# Patient Record
Sex: Male | Born: 1990 | Race: Black or African American | Hispanic: No | Marital: Single | Smoking: Never smoker
Health system: Southern US, Community
[De-identification: ages and names within clinical notes are randomized; demographics above are authoritative.]

## PROBLEM LIST (undated history)

## (undated) DIAGNOSIS — F840 Autistic disorder: Secondary | ICD-10-CM

## (undated) HISTORY — PX: DENTAL SURGERY: SHX609

---
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
## 2009-07-01 ENCOUNTER — Emergency Department (HOSPITAL_COMMUNITY): Admission: EM | Admit: 2009-07-01 | Discharge: 2009-07-01 | Payer: Self-pay | Admitting: Emergency Medicine

## 2011-04-12 NOTE — Op Note (Signed)
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   SURGEON:  Gwendlyn Deutscher, DDS   SURGICAL PROCEDURE:  Surgical removal of impacted teeth #1A, 1, 16, 17,  31, 32, and 32A and grossly carious tooth #19 and dentigerous cyst of  the right mandible.   INDICATIONS FOR PROCEDURE:  This is a 20 year old male who was referred  by his family dentist because of grossly carious tooth #19 that was  causing him pain and discomfort and also impacted third molar teeth to  be removed.  The impacted teeth showed no evidence of eruption and had  insufficient space for eruption.  He had also impacted tooth #31 and a  supernumerary impacted tooth in the right mandible superior to tooth  #31.  The patient was attempted to be done in the outpatient with  obvious sedation; however, because of his mental retardation, he was  uncooperative and required [facility].   PROCEDURE AND FINDINGS:  The patient was brought to the operating room.  After a ketamine sedation and intravenous line was established, the  patient was sedated and placed under general anesthesia and intubated  without complication with an orotracheal tube.  The  tube was placed on  the right side.  The patient was draped with four towels and plus sheet  and a head sheet.  One moist sponge was placed in oropharynx as a throat  pack and using 0.5% Marcaine with 1:200,000 epinephrine bilateral infra-  alveolar and lingual nerve blocks were achieved for postoperative  comfort and using 2% Xylocaine with 1:100,000 epinephrine posterior  superior nerve blocks and a greater palatine nerve blocks were achieved  for postoperative comfort.  Using a 15 scalpel blade, an incision was  made along the  interdental papillas of tooth 18 in the retromolar area.  A full-thickness mucoperiosteal flap was elevated laterally exposing the  area of impacted tooth #17, that was mesioangular.  A rotary osteotome  was used to remove the buccal bone to expose the tooth.  The tooth was  sectioned buccolingually and each rim was elevated from the supporting  alveolar structure.  The area was thoroughly debrided and irrigated.  Using a rotary osteotome, the roots of the carious tooth #19 were split.  The medial rim and the distal rim were removed with forceps.  The  area  was thoroughly debrided and irrigated and the incision was closed with  multiple interrupted 3-0 chromic sutures.  Moving to left maxilla, a 15  scalpel blade was used to make incision in the left retromolar area and  a full-thickness mucoperiosteal flap was elevated laterally.  The rotary  osteotome was used to remove buccal bone exposing the impacted tooth  #16.  Using appropriate elevators, the tooth was elevated from the  supporting alveolar structure.  The area was debrided and irrigated and  closed with multiple interrupted 3-0 gut sutures.  The endotracheal tube  was moved to the left side.  The throat pack was removed and repacked  and the mouth prop was placed on the left side.  Using a 15 scalpel  blade an incision was made anterior to tooth #30 and inter-dental  papillas and the retromolar area of  the right mandible was incised down  the periosteum.  A full-thickness mucoperiosteal flap was elevated  laterally exposing the right ramus in the body of the mandible.  Using a  rotary osteotome, buccal and occlusal bone was removed from impacted  tooth #32.  The tooth was split medio-distally and each rim was removed  with appropriate forceps.  A large dental follicle or cystic-like lesion  was noted and this was removed and curetted from the alveolar socket and  identified a dentigerous cyst and sent to pathology for histological  examination.  I partially impacted supernumerary tooth in the right  mandible distal to #30, was elevated from the supporting alveolar  structure.  Identification of impacted tooth #31 could then be seen.  The tooth was sectioned buccolingually and then the buccal bone was  removed from the area to completely expose the roots of tooth #31 and  each individual root was removed individually from the supporting  alveolar structure.  There was no excessive bleeding and there was no  evidence of nerve exposure.  The area was thoroughly debrided and  irrigated and the incision was closed with multiple interrupted 3-0 gut  sutures.  Moving to the right maxilla, an incision was made along with  the maxillary right tuberosity and then anteriorly to the first molar  tooth a releasing incision was made vertically.  A full-thickness  mucoperiosteal flap was elevated laterally.  Using a rotary osteotome  buccal and occlusal bone was removed and impacted supernumerary tooth  #1A was identified and removed from the supporting alveolar structure.  The tooth was only one-third of the size of a normal molar tooth.  The  crown of impacted tooth #1 could be identified and removed with multiple  elevators.  The area was thoroughly debrided and irrigated and the  incision was closed with multiple interrupted 3-0 gut sutures.   The patient tolerated the surgery and the anesthesia  without  complications.  Estimated blood loss was 25-50 mL.   The patient was awakened and taken to the recovery room in stable  condition.  He tolerated the surgery well.   SPECIMEN SENT:  Dentigerous cyst from right mandible for histological  examination.      Grant Ruts., D.D.S.  Electronically Signed     WB/MEDQ  D:  09/08/2008  T:  09/09/2008  Job:  [id number]

## 2011-12-21 ENCOUNTER — Encounter (HOSPITAL_COMMUNITY): Payer: Self-pay | Admitting: Emergency Medicine

## 2011-12-21 ENCOUNTER — Emergency Department (HOSPITAL_COMMUNITY)
Admission: EM | Admit: 2011-12-21 | Discharge: 2011-12-21 | Disposition: A | Discharge status: Home / Self Care | Payer: Medicaid Other | Attending: Emergency Medicine | Admitting: Emergency Medicine

## 2011-12-21 DIAGNOSIS — K625 Hemorrhage of anus and rectum: Secondary | ICD-10-CM | POA: Insufficient documentation

## 2011-12-21 DIAGNOSIS — K921 Melena: Secondary | ICD-10-CM | POA: Insufficient documentation

## 2011-12-21 HISTORY — DX: Autistic disorder: F84.0

## 2011-12-21 NOTE — ED Provider Notes (Signed)
History     CSN: [id number]  Arrival date & time 12/21/11  2142   First MD Initiated Contact with Patient 12/21/11 2214      Chief Complaint  Patient presents with  . Rectal Bleeding    (Consider location/radiation/quality/duration/timing/severity/associated sxs/prior treatment) HPI  Patient is brought to ER by his mother with complaint of blood in stool. Patient is autistic. Patient called his aunt after having a bowel movement stating "there is something funny in my underwear" and the aunt who is at bedside as well states that she found a "drop of bright red jelly like stool in his underwear." mother denies hx of GI bleed. Patient and mother deny abdominal pain, n/v, fevers, chills or any other complaint. The mother notes that the patient has a hx of intermittent constipation and straining.  Past Medical History  Diagnosis Date  . Autism     History reviewed. No pertinent past surgical history.  No family history on file.  History  Substance Use Topics  . Smoking status: Not on file  . Smokeless tobacco: Not on file  . Alcohol Use:       Review of Systems  All other systems reviewed and are negative.    Allergies  Review of patient's allergies indicates no known allergies.  Home Medications   Current Outpatient Rx  Name Route Sig Dispense Refill  . GABAPENTIN 300 MG PO CAPS Oral Take 300 mg by mouth at bedtime.       BP 118/84  Pulse 93  Temp 98 F (36.7 C)  Resp 16  Wt 140 lb (63.504 kg)  SpO2 99%  Physical Exam  Nursing note and vitals reviewed. Constitutional: He appears well-developed and well-nourished. No distress.  HENT:  Head: Normocephalic and atraumatic.  Eyes: Conjunctivae are normal.  Neck: Normal range of motion. Neck supple.  Cardiovascular: Normal rate, regular rhythm, normal heart sounds and intact distal pulses.  Exam reveals no gallop and no friction rub.   No murmur heard. Pulmonary/Chest: Effort normal and breath sounds normal.  No respiratory distress. He has no wheezes. He has no rales. He exhibits no tenderness.  Abdominal: Bowel sounds are normal. He exhibits no distension and no mass. There is no tenderness. There is no rebound and no guarding.  Genitourinary: Rectum normal and prostate normal. Guaiac negative stool.       No gross blood. Rectum is non tender. Good rectal tone.   Musculoskeletal: Normal range of motion. He exhibits no edema and no tenderness.  Neurological: He is alert.  Skin: Skin is warm and dry. No rash noted. He is not diaphoretic. No erythema.  Psychiatric: He has a normal mood and affect.    ED Course  Procedures (including critical care time)   Labs Reviewed  OCCULT BLOOD, POC DEVICE  POCT OCCULT BLOOD STOOL, DEVICE   No results found.   1. Blood in stool       MDM  Report of one episode of blood in stool with hemacult negative and no gross blood on exam. Abdomen soft and nontender with normal VS. Spoke at length with mother about changing or worsening of symptoms that should warrant return to ER for further work up otherwise following up with PCP for recheck. They voice understanding and are agreeable to plan.         Jenness Corner, [place] 12/21/11 2344

## 2011-12-21 NOTE — ED Notes (Signed)
Pt alert, presents with family, c/o noting "blood in stool", onset today, recent constipation, family noticed "jelly like" blood in underwear

## 2011-12-22 NOTE — ED Provider Notes (Signed)
Medical screening examination/treatment/procedure(s) were performed by non-physician practitioner and as supervising physician I was immediately available for consultation/collaboration.  Gerhard Munch, MD 12/22/11 0001

## 2018-09-08 ENCOUNTER — Emergency Department (HOSPITAL_COMMUNITY)
Admission: EM | Admit: 2018-09-08 | Discharge: 2018-09-08 | Disposition: A | Discharge status: Home / Self Care | Payer: Medicaid Other | Attending: Emergency Medicine | Admitting: Emergency Medicine

## 2018-09-08 ENCOUNTER — Emergency Department (HOSPITAL_COMMUNITY): Payer: Medicaid Other

## 2018-09-08 ENCOUNTER — Encounter (HOSPITAL_COMMUNITY): Payer: Self-pay | Admitting: Emergency Medicine

## 2018-09-08 DIAGNOSIS — R109 Unspecified abdominal pain: Secondary | ICD-10-CM | POA: Diagnosis present

## 2018-09-08 DIAGNOSIS — Z79899 Other long term (current) drug therapy: Secondary | ICD-10-CM | POA: Diagnosis not present

## 2018-09-08 DIAGNOSIS — F840 Autistic disorder: Secondary | ICD-10-CM | POA: Insufficient documentation

## 2018-09-08 LAB — URINALYSIS, ROUTINE W REFLEX MICROSCOPIC
Bilirubin Urine: NEGATIVE
GLUCOSE, UA: NEGATIVE mg/dL
Hgb urine dipstick: NEGATIVE
KETONES UR: NEGATIVE mg/dL
Leukocytes, UA: NEGATIVE
Nitrite: NEGATIVE
PH: 9 — AB (ref 5.0–8.0)
Protein, ur: NEGATIVE mg/dL
Specific Gravity, Urine: 1.008 (ref 1.005–1.030)

## 2018-09-08 LAB — COMPREHENSIVE METABOLIC PANEL
ALT: 17 U/L (ref 0–44)
AST: 24 U/L (ref 15–41)
Albumin: 3.8 g/dL (ref 3.5–5.0)
Alkaline Phosphatase: 47 U/L (ref 38–126)
Anion gap: 9 (ref 5–15)
BUN: 17 mg/dL (ref 6–20)
CHLORIDE: 102 mmol/L (ref 98–111)
CO2: 25 mmol/L (ref 22–32)
CREATININE: 0.95 mg/dL (ref 0.61–1.24)
Calcium: 9.5 mg/dL (ref 8.9–10.3)
GFR calc Af Amer: >60 mL/min (ref >60)
GLUCOSE: 101 mg/dL — AB (ref 70–99)
Potassium: 4.4 mmol/L (ref 3.5–5.1)
Sodium: 136 mmol/L (ref 135–145)
Total Bilirubin: 0.5 mg/dL (ref 0.3–1.2)
Total Protein: 6.8 g/dL (ref 6.5–8.1)

## 2018-09-08 LAB — CBC
HEMATOCRIT: 42 % (ref 39.0–52.0)
Hemoglobin: 13.8 g/dL (ref 13.0–17.0)
MCH: 30.3 pg (ref 26.0–34.0)
MCHC: 32.9 g/dL (ref 30.0–36.0)
MCV: 92.1 fL (ref 80.0–100.0)
Platelets: 154 10*3/uL (ref 150–400)
RBC: 4.56 MIL/uL (ref 4.22–5.81)
RDW: 12.3 % (ref 11.5–15.5)
WBC: 6.9 10*3/uL (ref 4.0–10.5)
nRBC: 0 % (ref 0.0–0.2)

## 2018-09-08 LAB — LIPASE, BLOOD: LIPASE: 49 U/L (ref 11–51)

## 2018-09-08 MED ORDER — IOHEXOL 300 MG/ML  SOLN
100.0000 mL | Freq: Once | INTRAMUSCULAR | Status: AC | PRN
  Administered 2018-09-08: 100 mL via INTRAVENOUS

## 2018-09-08 MED ORDER — HALOPERIDOL 1 MG PO TABS: 1.0000 mg | ORAL_TABLET | Freq: Once | ORAL | Status: DC

## 2018-09-08 MED ORDER — HALOPERIDOL LACTATE 5 MG/ML IJ SOLN
2.0000 mg | Freq: Once | INTRAMUSCULAR | Status: DC
  Filled 2018-09-08: qty 1

## 2018-09-08 MED ORDER — LORAZEPAM 2 MG/ML IJ SOLN
2.0000 mg | Freq: Once | INTRAMUSCULAR | Status: DC
Start: 2018-09-08 — End: 2018-09-09
  Filled 2018-09-08: qty 1

## 2018-09-08 MED ORDER — SODIUM CHLORIDE 0.9 % IV SOLN: INTRAVENOUS | Status: DC

## 2018-09-08 MED ORDER — MIDAZOLAM HCL 2 MG/ML PO SYRP
4.0000 mg | ORAL_SOLUTION | Freq: Once | ORAL | Status: AC
  Administered 2018-09-08: 4 mg via ORAL

## 2018-09-08 MED ORDER — LORAZEPAM 1 MG PO TABS
2.0000 mg | ORAL_TABLET | Freq: Once | ORAL | Status: AC
  Administered 2018-09-08: 2 mg via ORAL
  Filled 2018-09-08: qty 2

## 2018-09-08 NOTE — ED Notes (Signed)
Pt refuses IM, crying - "no mommy, please. I want to go home" Dr made aware.

## 2018-09-08 NOTE — ED Notes (Signed)
Pt has a hx of Autism, is pacing around the room, Mother states pt will need some sedation prior to having any lab work done. MD made aware.

## 2018-09-08 NOTE — ED Provider Notes (Signed)
[facility] EMERGENCY DEPARTMENT Provider Note   CSN: [id number] Arrival date & time: 09/08/18  1639     History   Chief Complaint Chief Complaint  Patient presents with  . Abdominal Pain  . Constipation    HPI Frederick Wilson is a 27 y.o. male.  27 year old male with history of autism who presents from outpatient clinic due to abdominal distention and possible volvulus diagnosed an acute abdominal series.  Mother states that she felt the patient may have had a UTI because of dark urine.  He had been complaining of lower abdominal discomfort.  No reported fever, vomiting.  Patient has chronic constipation which is unchanged.  No prior history of abdominal surgeries.  No treatment used prior to arrival.  Nothing makes his symptoms better or worse.     Past Medical History:  Diagnosis Date  . Autism     There are no active problems to display for this patient.   History reviewed. No pertinent surgical history.      Home Medications    Prior to Admission medications   Medication Sig Start Date End Date Taking? Authorizing Provider  gabapentin (NEURONTIN) 300 MG capsule Take 300 mg by mouth at bedtime.     [provider]    Family History No family history on file.  Social History Social History   Tobacco Use  . Smoking status: Not on file  Substance Use Topics  . Alcohol use: Not on file  . Drug use: Not on file     Allergies   Patient has no known allergies.   Review of Systems Review of Systems  All other systems reviewed and are negative.    Physical Exam Updated Vital Signs BP (!) 134/102   Pulse 93   Temp (!) 97.5 F (36.4 C) (Oral)   Resp 20   Wt 63.5 kg   SpO2 98%   Physical Exam  Constitutional: He is oriented to person, place, and time. He appears well-developed and well-nourished.  Non-toxic appearance. No distress.  HENT:  Head: Normocephalic and atraumatic.  Eyes: Pupils are equal, round, and  reactive to light. Conjunctivae, EOM and lids are normal.  Neck: Normal range of motion. Neck supple. No tracheal deviation present. No thyroid mass present.  Cardiovascular: Normal rate, regular rhythm and normal heart sounds. Exam reveals no gallop.  No murmur heard. Pulmonary/Chest: Effort normal and breath sounds normal. No stridor. No respiratory distress. He has no decreased breath sounds. He has no wheezes. He has no rhonchi. He has no rales.  Abdominal: Soft. Normal appearance and bowel sounds are normal. He exhibits distension. There is no tenderness. There is no rigidity, no rebound, no guarding and no CVA tenderness.  Musculoskeletal: Normal range of motion. He exhibits no edema or tenderness.  Neurological: He is alert and oriented to person, place, and time. He has normal strength. No cranial nerve deficit or sensory deficit. GCS eye subscore is 4. GCS verbal subscore is 5. GCS motor subscore is 6.  Skin: Skin is warm and dry. No abrasion and no rash noted.  Psychiatric: His affect is blunt. He is inattentive.  Nursing note and vitals reviewed.    ED Treatments / Results  Labs (all labs ordered are listed, but only abnormal results are displayed) Labs Reviewed  LIPASE, BLOOD  COMPREHENSIVE METABOLIC PANEL  CBC  URINALYSIS, ROUTINE W REFLEX MICROSCOPIC    EKG None  Radiology No results found.  Procedures Procedures (including critical care time)  Medications Ordered in ED Medications  LORazepam (ATIVAN) injection 2 mg (has no administration in time range)  0.9 %  sodium chloride infusion (has no administration in time range)     Initial Impression / Assessment and Plan / ED Course  I have reviewed the triage vital signs and the nursing notes.  Pertinent labs & imaging results that were available during my care of the patient were reviewed by me and considered in my medical decision making (see chart for details).     Required oral sedation with Ativan and  Versed to have his labs drawn.  Abdominal CT without evidence of volvulus at this time.  No evidence of leukocytosis on his CBC.  Lites lites within normal limits.  Urinalysis negative.  Patient is in no acute distress at this time is stable for discharge  Final Clinical Impressions(s) / ED Diagnoses   Final diagnoses:  None    ED Discharge Orders    None       Lorre Nick, MD 09/08/18 2310

## 2018-09-08 NOTE — ED Triage Notes (Addendum)
Pt presents with RLQ abd pain and constipation x 1 week; pt was seen at [facility] medical and xrays done (disc with family), no paperwork given, pt denying pain currently; mother at bedside reporting that the provider at [facility] stated there was an abnormal finding in the diagnostic imaging (twisted bowel?), triage RN will call to verify

## 2019-02-17 ENCOUNTER — Other Ambulatory Visit: Payer: Self-pay

## 2019-02-17 ENCOUNTER — Emergency Department (HOSPITAL_COMMUNITY)
Admission: EM | Admit: 2019-02-17 | Discharge: 2019-02-17 | Disposition: A | Discharge status: Home / Self Care | Payer: Medicaid Other | Attending: Emergency Medicine | Admitting: Emergency Medicine

## 2019-02-17 DIAGNOSIS — R05 Cough: Secondary | ICD-10-CM | POA: Diagnosis not present

## 2019-02-17 DIAGNOSIS — R69 Illness, unspecified: Secondary | ICD-10-CM

## 2019-02-17 DIAGNOSIS — J111 Influenza due to unidentified influenza virus with other respiratory manifestations: Secondary | ICD-10-CM | POA: Insufficient documentation

## 2019-02-17 DIAGNOSIS — R0981 Nasal congestion: Secondary | ICD-10-CM | POA: Diagnosis not present

## 2019-02-17 DIAGNOSIS — R509 Fever, unspecified: Secondary | ICD-10-CM | POA: Diagnosis present

## 2019-02-17 MED ORDER — OSELTAMIVIR PHOSPHATE 75 MG PO CAPS: 75.0000 mg | ORAL_CAPSULE | Freq: Two times a day (BID) | ORAL | 0 refills | Status: DC

## 2019-02-17 MED ORDER — ACETAMINOPHEN 325 MG PO TABS
650.0000 mg | ORAL_TABLET | Freq: Once | ORAL | Status: AC
  Administered 2019-02-17: 650 mg via ORAL
  Filled 2019-02-17: qty 2

## 2019-02-17 NOTE — Discharge Instructions (Addendum)
Follow healthcare providers instructions Make sure that you understand and can help the patient follow any healthcare provider instructions for all care.  Provide for the patients basic needs You should help the patient with basic needs in the home and provide support for getting groceries, prescriptions, and other personal needs.  Monitor the patients symptoms If they are getting sicker, call his or her medical provider a  This will help the healthcare providers office take steps to keep other people from getting infected. Ask the healthcare provider to call the local or state health department.  Limit the number of people who have contact with the patient If possible, have only one caregiver for the patient. Other household members should stay in another home or place of residence. If this is not possible, they should stay in another room, or be separated from the patient as much as possible. Use a separate bathroom, if available. Restrict visitors who do not have an essential need to be in the home.  Keep older adults, very young children, and other sick people away from the patient Keep older adults, very young children, and those who have compromised immune systems or chronic health conditions away from the patient. This includes people with chronic heart, lung, or kidney conditions, diabetes, and cancer.  Ensure good ventilation Make sure that shared spaces in the home have good air flow, such as from an air conditioner or an opened window, weather permitting.  Wash your hands often Wash your hands often and thoroughly with soap and water for at least 20 seconds. You can use an alcohol based hand sanitizer if soap and water are not available and if your hands are not visibly dirty. Avoid touching your eyes, nose, and mouth with unwashed hands. Use disposable paper towels to dry your hands. If not available, use dedicated cloth towels and replace them when they become  wet.  Wear a facemask and gloves Wear a disposable facemask at all times in the room and gloves when you touch or have contact with the patients blood, body fluids, and/or secretions or excretions, such as sweat, saliva, sputum, nasal mucus, vomit, urine, or feces.  Ensure the mask fits over your nose and mouth tightly, and do not touch it during use. Throw out disposable facemasks and gloves after using them. Do not reuse. Wash your hands immediately after removing your facemask and gloves. If your personal clothing becomes contaminated, carefully remove clothing and launder. Wash your hands after handling contaminated clothing. Place all used disposable facemasks, gloves, and other waste in a lined container before disposing them with other household waste. Remove gloves and wash your hands immediately after handling these items.  Do not share dishes, glasses, or other household items with the patient Avoid sharing household items. You should not share dishes, drinking glasses, cups, eating utensils, towels, bedding, or other items After the person uses these items, you should wash them thoroughly with soap and water.  Wash laundry thoroughly Immediately remove and wash clothes or bedding that have blood, body fluids, and/or secretions or excretions, such as sweat, saliva, sputum, nasal mucus, vomit, urine, or feces, on them. Wear gloves when handling laundry from the patient. Read and follow directions on labels of laundry or clothing items and detergent. In general, wash and dry with the warmest temperatures recommended on the label.  Clean all areas the individual has used often Clean all touchable surfaces, such as counters, tabletops, doorknobs, bathroom fixtures, toilets, phones, keyboards, tablets, and bedside tables, every  day. Also, clean any surfaces that may have blood, body fluids, and/or secretions or excretions on them. Wear gloves when cleaning surfaces the patient has come in  contact with. Use a diluted bleach solution (e.g., dilute bleach with 1 part bleach and 10 parts water) or a household disinfectant with a label that says EPA-registered for coronaviruses. To make a bleach solution at home, add 1 tablespoon of bleach to 1 quart (4 cups) of water. For a larger supply, add  cup of bleach to 1 gallon (16 cups) of water. Read labels of cleaning products and follow recommendations provided on product labels. Labels contain instructions for safe and effective use of the cleaning product including precautions you should take when applying the product, such as wearing gloves or eye protection and making sure you have good ventilation during use of the product. Remove gloves and wash hands immediately after cleaning.  Monitor yourself for signs and symptoms of illness Caregivers and household members are considered close contacts, should monitor their health, and will be asked to limit movement outside of the home to the extent possible. Follow the monitoring steps for close contacts listed on the symptom monitoring form.   ? If you have additional questions, contact your local health department or call the epidemiologist on call at [phone] (available 24/7). ?Please remain quarantined until symptoms resolved for 3 days and at least 7 days from beginning of symptoms

## 2019-02-17 NOTE — ED Provider Notes (Signed)
[facility] EMERGENCY DEPARTMENT Provider Note   CSN: [id number] Arrival date & time: 02/17/19  1100    History   Chief Complaint No chief complaint on file.   HPI Frederick Wilson is a 28 y.o. male.     HPI  28 yo male with cough, fever, nasal congestion with known exposure to father at rest..  Mother is a historian.  Patient has some developmental delay and autism.  He lives in [place]. Maisie Fus his father.  His father was diagnosed with flu on Friday with a positive test.  Patient has not had flu vaccine this year.  Mother states she is otherwise appears well with some decreased appetite but has been taking p.o.  He has not had nausea or vomiting.  He has no other major health problems.  Past Medical History:  Diagnosis Date  . Autism     There are no active problems to display for this patient.   No past surgical history on file.      Home Medications    Prior to Admission medications   Medication Sig Start Date End Date Taking? Authorizing Provider  acetaminophen (TYLENOL) 500 MG tablet Take 1,000 mg by mouth every 6 (six) hours as needed for mild pain or headache.    [provider]    Family History No family history on file.  Social History Social History   Tobacco Use  . Smoking status: Not on file  Substance Use Topics  . Alcohol use: Not on file  . Drug use: Not on file     Allergies   Patient has no known allergies.   Review of Systems Review of Systems  All other systems reviewed and are negative.    Physical Exam Updated Vital Signs BP (!) 132/92 (BP Location: Right Arm)   Pulse (!) 129   Temp 100.1 F (37.8 C) (Oral)   Resp 16   SpO2 100%   Physical Exam Vitals signs and nursing note reviewed.  Constitutional:      General: He is not in acute distress.    Appearance: Normal appearance. He is not ill-appearing.     Comments: Noted that initial heart rate was noted to be 129.  On my exam, heart rate was  94.  HENT:     Head: Normocephalic and atraumatic.     Left Ear: External ear normal.     Nose: Nose normal.     Mouth/Throat:     Mouth: Mucous membranes are moist.     Pharynx: Oropharynx is clear.  Eyes:     Pupils: Pupils are equal, round, and reactive to light.  Neck:     Musculoskeletal: Normal range of motion.  Cardiovascular:     Rate and Rhythm: Normal rate and regular rhythm.  Pulmonary:     Effort: Pulmonary effort is normal. No respiratory distress.     Breath sounds: No wheezing, rhonchi or rales.  Abdominal:     General: Abdomen is flat. Bowel sounds are normal.     Palpations: Abdomen is soft.  Musculoskeletal: Normal range of motion.  Skin:    General: Skin is warm and dry.     Capillary Refill: Capillary refill takes less than 2 seconds.  Neurological:     General: No focal deficit present.     Mental Status: He is alert. Mental status is at baseline.  Psychiatric:        Mood and Affect: Mood normal.  ED Treatments / Results  Labs (all labs ordered are listed, but only abnormal results are displayed) Labs Reviewed - No data to display  EKG None  Radiology No results found.  Procedures Procedures (including critical care time)  Medications Ordered in ED Medications - No data to display   Initial Impression / Assessment and Plan / ED Course  I have reviewed the triage vital signs and the nursing notes.  Pertinent labs & imaging results that were available during my care of the patient were reviewed by me and considered in my medical decision making (see chart for details).        Well-appearing 28 year old with known exposure to influenza.  Prescription for Tamiflu.  Mother advised regarding treatment at home and need for follow-up and voices understanding.  Quarantine guidelines given. Final Clinical Impressions(s) / ED Diagnoses   Final diagnoses:  Influenza-like illness    ED Discharge Orders    None       Margarita Grizzle,  MD 02/17/19 1148

## 2019-02-17 NOTE — ED Notes (Signed)
Patient verbalizes understanding of discharge instructions . Opportunity for questions and answers were provided . Armband removed by staff ,Pt discharged from ED. W/C  offered at D/C  and Declined W/C at D/C and was escorted to lobby by RN.  

## 2019-02-17 NOTE — ED Triage Notes (Signed)
Pt here for evaluation of cough and fever onset this morning. Pt's father was dx with flu last week.

## 2019-10-29 DIAGNOSIS — U071 COVID-19: Secondary | ICD-10-CM

## 2019-10-29 HISTORY — DX: COVID-19: U07.1

## 2020-03-22 ENCOUNTER — Encounter (HOSPITAL_COMMUNITY): Payer: Self-pay | Admitting: Emergency Medicine

## 2020-03-22 ENCOUNTER — Emergency Department (HOSPITAL_COMMUNITY): Payer: Medicaid Other

## 2020-03-22 ENCOUNTER — Other Ambulatory Visit: Payer: Self-pay

## 2020-03-22 ENCOUNTER — Emergency Department (HOSPITAL_COMMUNITY)
Admission: EM | Admit: 2020-03-22 | Discharge: 2020-03-22 | Disposition: A | Discharge status: Home / Self Care | Payer: Medicaid Other | Attending: Emergency Medicine | Admitting: Emergency Medicine

## 2020-03-22 DIAGNOSIS — S90212A Contusion of left great toe with damage to nail, initial encounter: Secondary | ICD-10-CM

## 2020-03-22 DIAGNOSIS — Y939 Activity, unspecified: Secondary | ICD-10-CM | POA: Diagnosis not present

## 2020-03-22 DIAGNOSIS — Y999 Unspecified external cause status: Secondary | ICD-10-CM | POA: Diagnosis not present

## 2020-03-22 DIAGNOSIS — X58XXXA Exposure to other specified factors, initial encounter: Secondary | ICD-10-CM | POA: Diagnosis not present

## 2020-03-22 DIAGNOSIS — Y929 Unspecified place or not applicable: Secondary | ICD-10-CM | POA: Insufficient documentation

## 2020-03-22 DIAGNOSIS — S99922A Unspecified injury of left foot, initial encounter: Secondary | ICD-10-CM | POA: Diagnosis present

## 2020-03-22 DIAGNOSIS — M79676 Pain in unspecified toe(s): Secondary | ICD-10-CM

## 2020-03-22 DIAGNOSIS — F840 Autistic disorder: Secondary | ICD-10-CM | POA: Diagnosis not present

## 2020-03-22 NOTE — ED Provider Notes (Signed)
[facility] Provider Note   CSN: [id number] Arrival date & time: 03/22/20  1350     History Chief Complaint  Patient presents with  . Toe Pain    [facility] is a 29 y.o. male.  Patient with autism hx presents with left great toe swelling for 2 days. NO injury recalled or infectious sxs, no gout hx.  Pt can walk on it.          Past Medical History:  Diagnosis Date  . Autism     There are no problems to display for this patient.   History reviewed. No pertinent surgical history.     No family history on file.  Social History   Tobacco Use  . Smoking status: Never Smoker  . Smokeless tobacco: Never Used  Substance Use Topics  . Alcohol use: Not Currently  . Drug use: Not Currently    Home Medications Prior to Admission medications   Medication Sig Start Date End Date Taking? Authorizing Provider  acetaminophen (TYLENOL) 500 MG tablet Take 1,000 mg by mouth every 6 (six) hours as needed for mild pain or headache.    [provider]  oseltamivir (TAMIFLU) 75 MG capsule Take 1 capsule (75 mg total) by mouth every 12 (twelve) hours. 02/17/19   Pattricia Boss, MD    Allergies    Patient has no known allergies.  Review of Systems   Review of Systems  Unable to perform ROS: Other    Physical Exam Updated Vital Signs BP (!) 135/97 (BP Location: Left Arm)   Pulse 68   Temp 98 F (36.7 C) (Oral)   Resp 18   SpO2 100%   Physical Exam Vitals and nursing note reviewed.  HENT:     Head: Normocephalic.     Mouth/Throat:     Mouth: Mucous membranes are moist.  Cardiovascular:     Rate and Rhythm: Normal rate.  Musculoskeletal:        General: Swelling and tenderness present. No deformity. Normal range of motion.  Skin:    General: Skin is warm.     Capillary Refill: Capillary refill takes less than 2 seconds.     Comments: Pt has discolored left nail of great toe with swelling beneath and  surrounding nail, no warmth or erythema, minimal tenderness  Neurological:     General: No focal deficit present.     Mental Status: He is alert.     ED Results / Procedures / Treatments   Labs (all labs ordered are listed, but only abnormal results are displayed) Labs Reviewed - No data to display  EKG None  Radiology DG Foot Complete Left  Result Date: 03/22/2020 CLINICAL DATA:  Great toe swelling for 2 days. No known injury. EXAM: LEFT FOOT - COMPLETE 3+ VIEW COMPARISON:  None FINDINGS: Soft tissue swelling about the first metatarsal more so than other areas and more pronounced over the plantar surface of the foot. No radiopaque foreign body. No evidence of fracture. IMPRESSION: Soft tissue swelling about the forefoot along the plantar aspect. Correlate with any clinical evidence of penetrating injury or other finding that would explain this constellation of imaging findings. No radiopaque foreign body on today's imaging. No acute bony process. Electronically Signed   By: Zetta Bills M.D.   On: 03/22/2020 15:11    Procedures .Marland KitchenIncision and Drainage  Date/Time: 03/22/2020 3:59 PM Performed by: Elnora Morrison, MD Authorized by: Elnora Morrison, MD   Consent:  Consent obtained:  Verbal   Consent given by:  Parent   Risks discussed:  Bleeding, incomplete drainage, infection, damage to other organs and pain   Alternatives discussed:  No treatment Location:    Type:  Hematoma   Size:  2 cm   Location:  Lower extremity   Lower extremity location:  Toe   Toe location:  L big toe Pre-procedure details:    Skin preparation:  Chloraprep Anesthesia (see MAR for exact dosages):    Anesthesia method:  None Procedure type:    Complexity:  Simple Procedure details:    Incision types:  Stab incision   Incision depth:  Dermal   Scalpel blade:  11   Drainage:  Bloody   Drainage amount:  Moderate   Wound treatment:  Wound left open   Packing materials:  None Post-procedure  details:    Patient tolerance of procedure:  Tolerated well, no immediate complications Comments:     Great toe subung hematoma   (including critical care time)  Medications Ordered in ED Medications - No data to display  ED Course  I have reviewed the triage vital signs and the nursing notes.  Pertinent labs & imaging results that were available during my care of the patient were reviewed by me and considered in my medical decision making (see chart for details).    MDM Rules/Calculators/A&P                      Pt with subungual hematoma.  Pt does not recall injury however autistic. Xray no fx, reviewed. Hematoma drained after cleaning. Fup discussed.   Final Clinical Impression(s) / ED Diagnoses Final diagnoses:  Toe pain  Subungual hematoma of great toe of left foot, initial encounter    Rx / DC Orders ED Discharge Orders    None       Blane Ohara, MD 03/22/20 1600

## 2020-03-22 NOTE — Discharge Instructions (Signed)
Apply topical antibiotics twice daily for 3 to 4 days. Return for fevers, spreading redness or new concerns. Tylenol for pain as needed

## 2020-03-22 NOTE — ED Triage Notes (Signed)
Mom reports L great toe swelling x 2 days.  No known injury.

## 2020-05-23 IMAGING — CT CT ABD-PELV W/ CM
2 of 4 series · 17 of 46 positions shown, 19 images · IV contrast (APPLIED)
Comparison: No priors.

CLINICAL DATA: 26-year-old male with history of right lower
quadrant abdominal pain and constipation for 1 week.

EXAM:
CT ABDOMEN AND PELVIS WITH CONTRAST
TECHNIQUE: Multidetector CT imaging of the abdomen and pelvis was performed
using the standard protocol following bolus administration of
intravenous contrast.
CONTRAST:  100mL OMNIPAQUE IOHEXOL 300 MG/ML  SOLN

[Series 3: abdomen 5.0 · axial · 0.84mm/px · z∈[+868,+1303]mm · 14 of 99 slices shown, 16 images]
[im 6/99  soft-tissue]
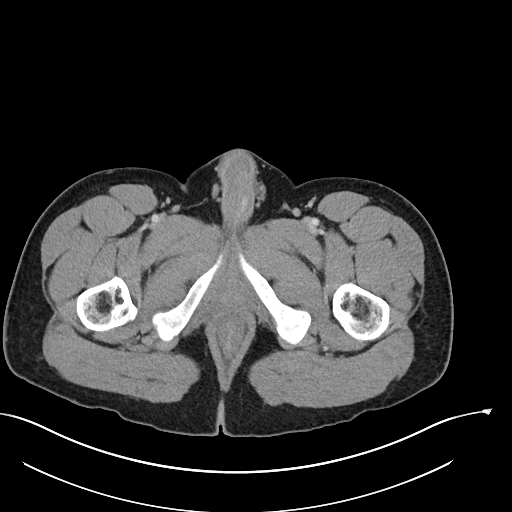
[im 6/99  bone]
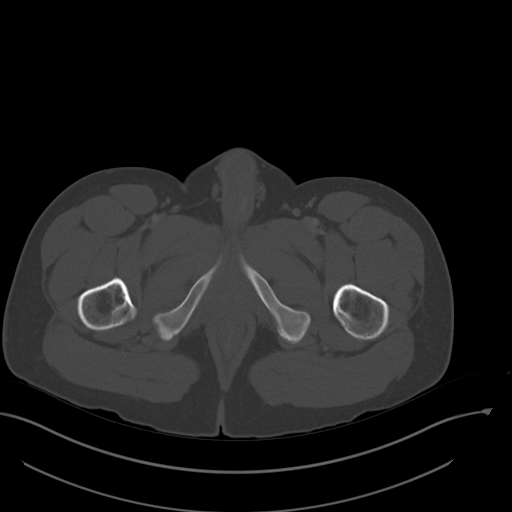
[im 11/99  soft-tissue]
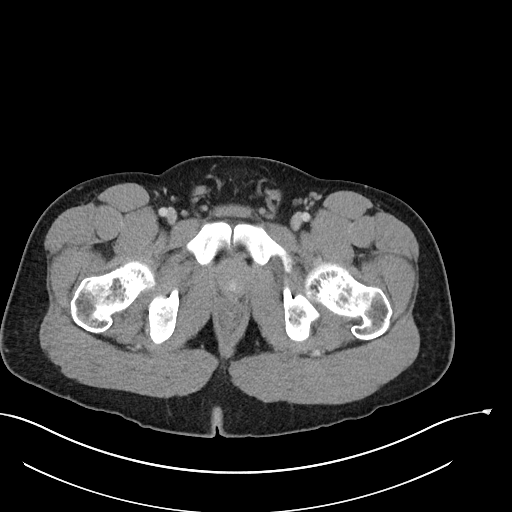
[im 21/99  soft-tissue]
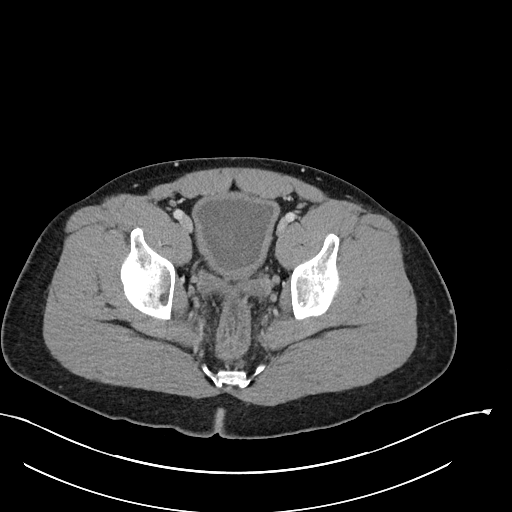
[im 26/99  soft-tissue]
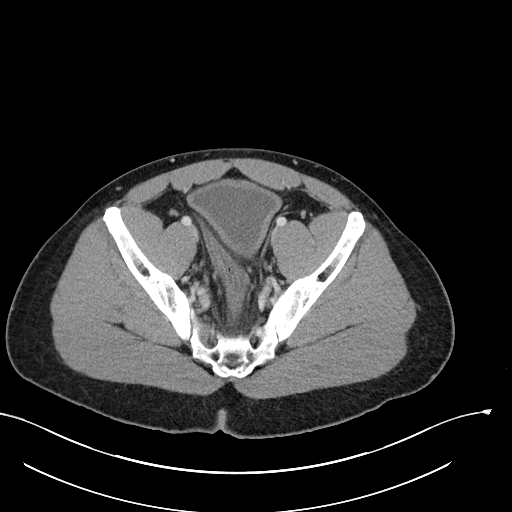
[im 31/99  soft-tissue]
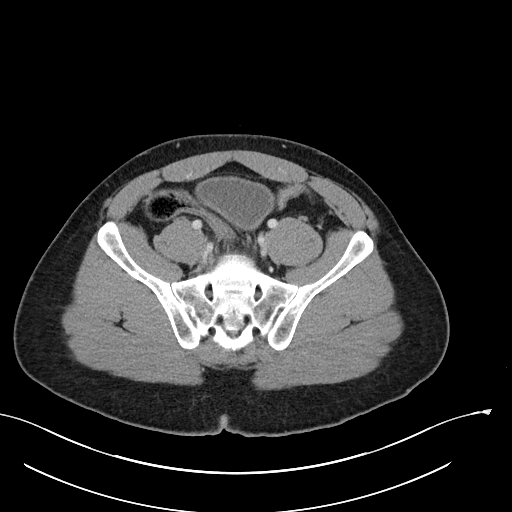
[im 42/99  soft-tissue]
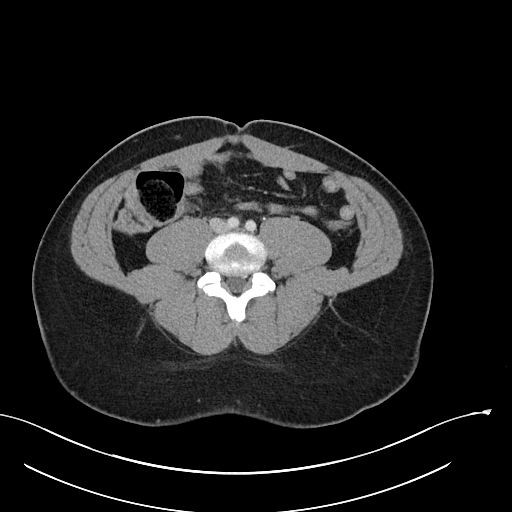
[im 47/99  soft-tissue]
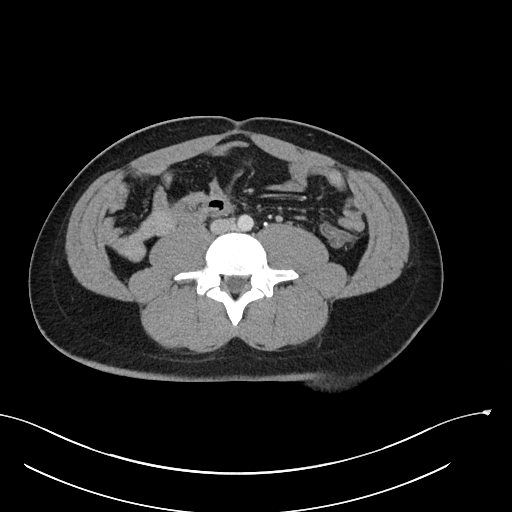
[im 52/99  soft-tissue]
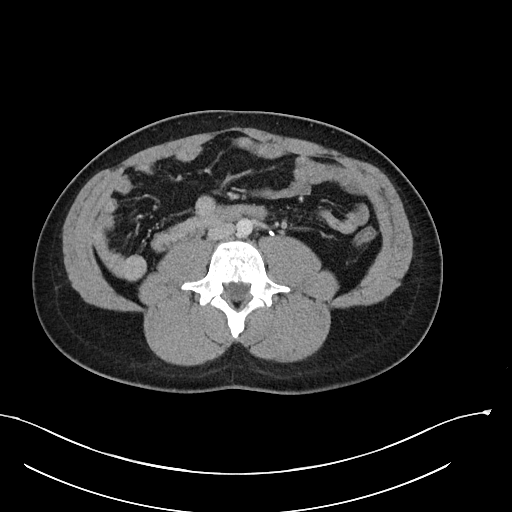
[im 57/99  soft-tissue]
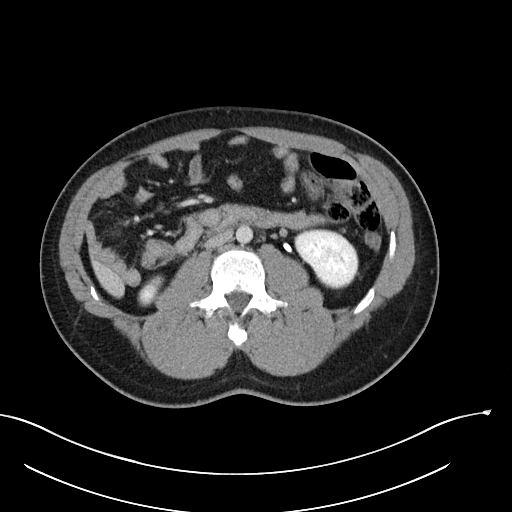
[im 57/99  bone]
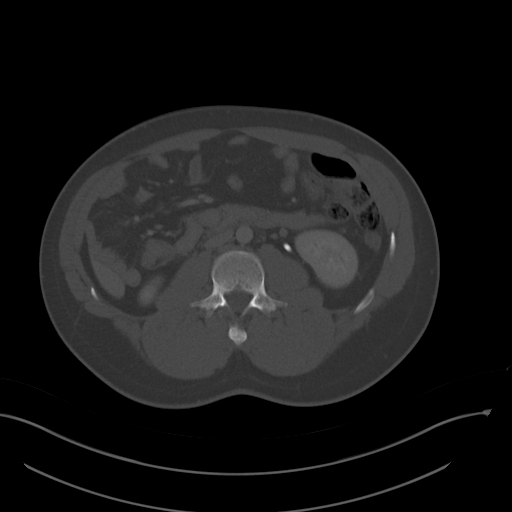
[im 68/99  soft-tissue]
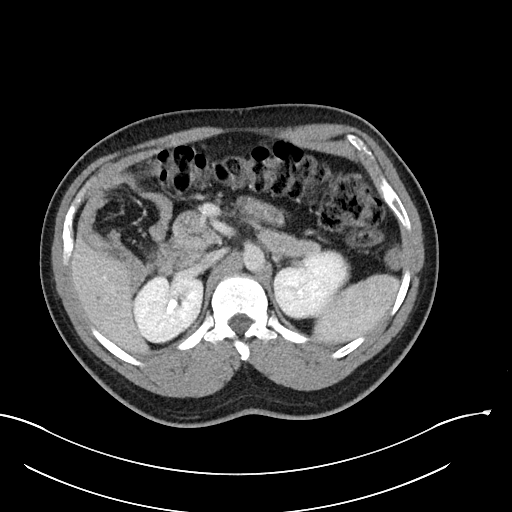
[im 73/99  soft-tissue]
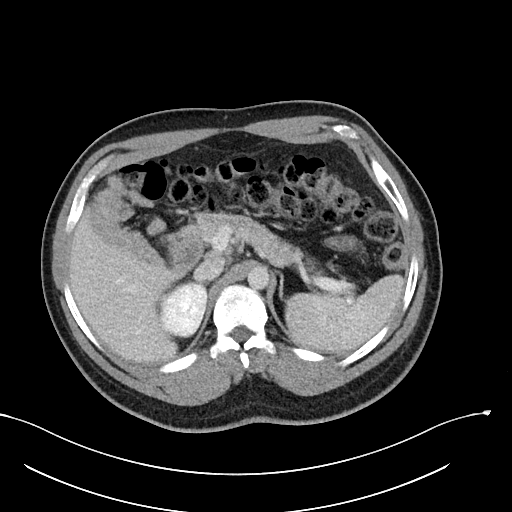
[im 78/99  soft-tissue]
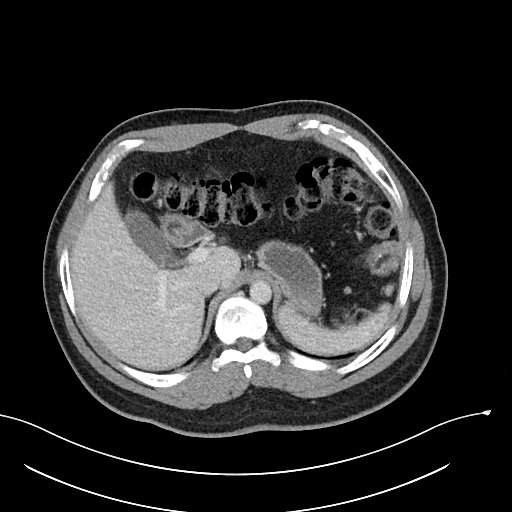
[im 88/99  soft-tissue]
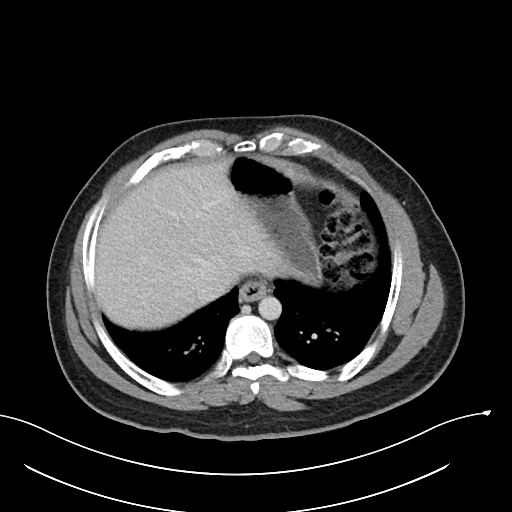
[im 93/99  soft-tissue]
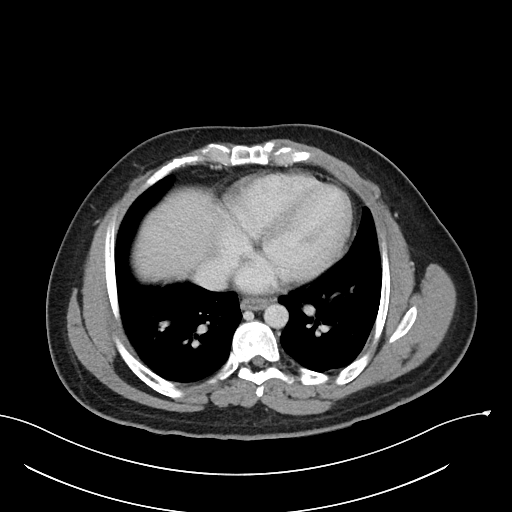

[Series 6: abdomen 3.0 mpr cor · coronal · 0.96mm/px · 3 of 101 slices shown]
[im 34/101  soft-tissue]
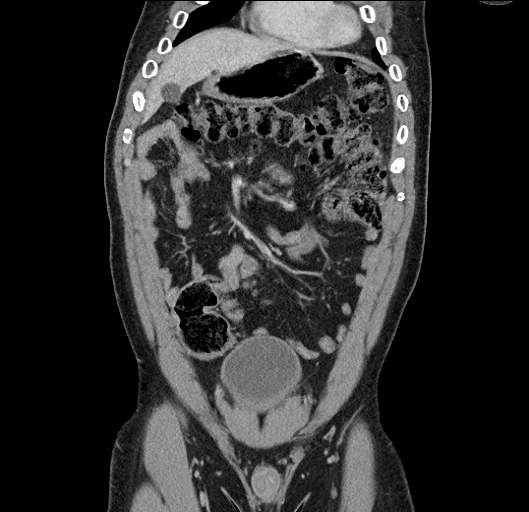
[im 45/101  soft-tissue]
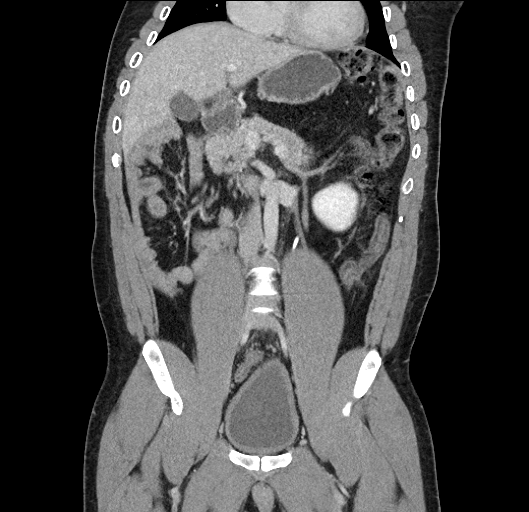
[im 56/101  soft-tissue]
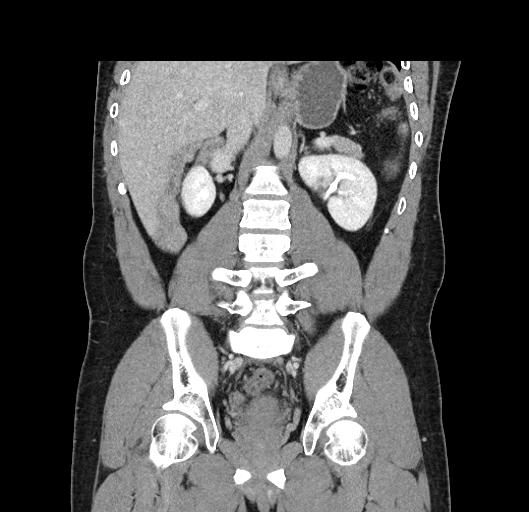

[17 of 46 positions shown; findings below may reference images not displayed]

FINDINGS: Lower chest: Cardiomegaly.

Hepatobiliary: No suspicious cystic or solid hepatic lesions. No
intra or extrahepatic biliary ductal dilatation. Gallbladder is
normal in appearance.

Pancreas: No pancreatic mass. No pancreatic ductal dilatation. No
pancreatic or peripancreatic fluid or inflammatory changes.

Spleen: Unremarkable.

Adrenals/Urinary Tract: Bilateral kidneys and bilateral adrenal
glands are normal in appearance. No hydroureteronephrosis. Urinary
bladder is normal in appearance.

Stomach/Bowel: Normal appearance of the stomach. No pathologic
dilatation of small bowel or colon. Hypermobile cecum with tip in
the left upper quadrant of the abdomen (normal anatomical variant).
Normal appendix.

Vascular/Lymphatic: No significant atherosclerotic disease, aneurysm
or dissection noted in the abdominal or pelvic vasculature. No
lymphadenopathy noted in the abdomen or pelvis.

Reproductive: Prostate gland and seminal vesicles are unremarkable
in appearance.

Other: No significant volume of ascites.  No pneumoperitoneum.

Musculoskeletal: There are no aggressive appearing lytic or blastic
lesions noted in the visualized portions of the skeleton.
IMPRESSION: 1. No acute findings are noted in the abdomen or pelvis to account
for the patient's symptoms.
2. Normal appendix.
3. Incidental findings, as above.

## 2022-02-02 NOTE — H&P (Signed)
?  Patient: [facility]  PID: [id number]  DOB: [birth year]  SEX: Male  ? ?Self Referral by mother. Patient Autistic. ? ?CC: pain upper right tooth (per mother) ? ?Past Medical History:  Autism   ? ?Medications: None   ? ?Allergies:     None   ? ?Surgeries:   Teeth Cleaning    ?                   ?Exam: BMI 27. Caries tooth #4.  Limited oral exam. No purulence, edema, fluctuance, trismus. Oral cancer screening negative. Pharynx clear. Mallampati 1. No lymphadenopathy. ? ?Panorex: Caries tooth #4.  ? ?Assessment:  ASA  2. Non-restorable tooth#  4.            ? ?Plan: Extraction Tooth # 4. EUA. Additional extractions if indicated.  IV Sedation.               ? ?Rx: none            ? ?Risks and complications explained. Questions answered.  ? ?Georgia Lopes, DMD ? ?

## 2022-02-03 ENCOUNTER — Encounter (HOSPITAL_COMMUNITY): Payer: Self-pay | Admitting: Oral Surgery

## 2022-02-03 ENCOUNTER — Other Ambulatory Visit: Payer: Self-pay

## 2022-02-03 NOTE — Anesthesia Preprocedure Evaluation (Addendum)
Anesthesia Evaluation  ?Patient identified by MRN, date of birth, ID band ?Patient awake ? ? ? ?Reviewed: ?Allergy & Precautions, NPO status , Patient's Chart, lab work & pertinent test results ? ?Airway ?Mallampati: II ? ?TM Distance: >3 FB ?Neck ROM: Full ? ? ? Dental ?no notable dental hx. ?(+) Poor Dentition, Lower Dentures ?  ?Pulmonary ?neg pulmonary ROS,  ?  ?Pulmonary exam normal ?breath sounds clear to auscultation ? ? ? ? ? ? Cardiovascular ?negative cardio ROS ?Normal cardiovascular exam ?Rhythm:Regular Rate:Normal ? ? ?  ?Neuro/Psych ?PSYCHIATRIC DISORDERS negative neurological ROS ?   ? GI/Hepatic ?negative GI ROS, Neg liver ROS,   ?Endo/Other  ?negative endocrine ROS ? Renal/GU ?negative Renal ROS  ? ?  ?Musculoskeletal ?negative musculoskeletal ROS ?(+)  ? Abdominal ?  ?Peds ? Hematology ?  ?Anesthesia Other Findings ?Autistic ? Reproductive/Obstetrics ? ?  ? ? ? ? ? ? ? ? ? ? ? ? ? ?  ?  ? ? ? ? ? ? ?Anesthesia Physical ?Anesthesia Plan ? ?ASA: 2 ? ?Anesthesia Plan: General  ? ?Post-op Pain Management:   ? ?Induction: Intravenous ? ?PONV Risk Score and Plan: 3 and Treatment may vary due to age or medical condition, Midazolam, Ondansetron and Dexamethasone ? ?Airway Management Planned: Nasal Cannula ? ?Additional Equipment: None ? ?Intra-op Plan:  ? ?Post-operative Plan: Extubation in OR ? ?Informed Consent: I have reviewed the patients History and Physical, chart, labs and discussed the procedure including the risks, benefits and alternatives for the proposed anesthesia with the patient or authorized representative who has indicated his/her understanding and acceptance.  ? ? ? ?Dental advisory given ? ?Plan Discussed with: CRNA and Anesthesiologist ? ?Anesthesia Plan Comments: (GA  ?IV access may be a problem may require PO ketamine)  ? ? ? ? ? ?Anesthesia Quick Evaluation ? ?

## 2022-02-03 NOTE — Progress Notes (Signed)
Spoke with pt's mother, [facility] for pre-op call. Pt is autistic. [facility] states he is on no medications. She will need to be in pre-op with pt in the AM.  ? ?Pt's surgery is scheduled as ambulatory so no Covid test is required prior to surgery. ? ?

## 2022-02-04 ENCOUNTER — Encounter (HOSPITAL_COMMUNITY)
Admission: RE | Disposition: A | Discharge status: Home / Self Care | Payer: Self-pay | Source: Home / Self Care | Attending: Oral Surgery

## 2022-02-04 ENCOUNTER — Other Ambulatory Visit: Payer: Self-pay

## 2022-02-04 ENCOUNTER — Ambulatory Visit (HOSPITAL_BASED_OUTPATIENT_CLINIC_OR_DEPARTMENT_OTHER): Payer: Medicaid Other | Admitting: Certified Registered"

## 2022-02-04 ENCOUNTER — Ambulatory Visit (HOSPITAL_COMMUNITY): Payer: Medicaid Other | Admitting: Certified Registered"

## 2022-02-04 ENCOUNTER — Ambulatory Visit (HOSPITAL_COMMUNITY)
Admission: RE | Admit: 2022-02-04 | Discharge: 2022-02-04 | Disposition: A | Discharge status: Home / Self Care | Payer: Medicaid Other | Attending: Oral Surgery | Admitting: Oral Surgery

## 2022-02-04 ENCOUNTER — Encounter (HOSPITAL_COMMUNITY): Payer: Self-pay | Admitting: Oral Surgery

## 2022-02-04 DIAGNOSIS — K029 Dental caries, unspecified: Secondary | ICD-10-CM | POA: Diagnosis not present

## 2022-02-04 DIAGNOSIS — F840 Autistic disorder: Secondary | ICD-10-CM | POA: Insufficient documentation

## 2022-02-04 DIAGNOSIS — K056 Periodontal disease, unspecified: Secondary | ICD-10-CM | POA: Insufficient documentation

## 2022-02-04 DIAGNOSIS — K036 Deposits [accretions] on teeth: Secondary | ICD-10-CM | POA: Diagnosis not present

## 2022-02-04 HISTORY — PX: TOOTH EXTRACTION: SHX859

## 2022-02-04 SURGERY — DENTAL RESTORATION/EXTRACTIONS
Anesthesia: General | Site: Mouth

## 2022-02-04 MED ORDER — OXYCODONE-ACETAMINOPHEN 5-325 MG PO TABS
1.0000 | ORAL_TABLET | Freq: Four times a day (QID) | ORAL | 0 refills | Status: AC | PRN
Start: 2022-02-04 — End: ?

## 2022-02-04 MED ORDER — ONDANSETRON HCL 4 MG/2ML IJ SOLN: 4.0000 mg | Freq: Once | INTRAMUSCULAR | Status: DC | PRN

## 2022-02-04 MED ORDER — FENTANYL CITRATE (PF) 250 MCG/5ML IJ SOLN
INTRAMUSCULAR | Status: AC
  Filled 2022-02-04: qty 5

## 2022-02-04 MED ORDER — CHLORHEXIDINE GLUCONATE 0.12 % MT SOLN
OROMUCOSAL | Status: AC
  Administered 2022-02-04: 15 mL via OROMUCOSAL
  Filled 2022-02-04: qty 15

## 2022-02-04 MED ORDER — DEXAMETHASONE SODIUM PHOSPHATE 10 MG/ML IJ SOLN
INTRAMUSCULAR | Status: AC
  Filled 2022-02-04: qty 1

## 2022-02-04 MED ORDER — PROPOFOL 10 MG/ML IV BOLUS
INTRAVENOUS | Status: AC
  Filled 2022-02-04: qty 20

## 2022-02-04 MED ORDER — OXYCODONE HCL 5 MG/5ML PO SOLN: 5.0000 mg | Freq: Once | ORAL | Status: DC | PRN

## 2022-02-04 MED ORDER — LACTATED RINGERS IV SOLN: INTRAVENOUS | Status: DC

## 2022-02-04 MED ORDER — LIDOCAINE-EPINEPHRINE 2 %-1:100000 IJ SOLN
INTRAMUSCULAR | Status: AC
  Filled 2022-02-04: qty 1

## 2022-02-04 MED ORDER — ONDANSETRON HCL 4 MG/2ML IJ SOLN
INTRAMUSCULAR | Status: DC | PRN
Start: 2022-02-04 — End: 2022-02-04
  Administered 2022-02-04: 4 mg via INTRAVENOUS

## 2022-02-04 MED ORDER — SUGAMMADEX SODIUM 200 MG/2ML IV SOLN
INTRAVENOUS | Status: DC | PRN
  Administered 2022-02-04: 400 mg via INTRAVENOUS

## 2022-02-04 MED ORDER — LIDOCAINE-EPINEPHRINE 2 %-1:100000 IJ SOLN
INTRAMUSCULAR | Status: DC | PRN
  Administered 2022-02-04: 8 mL

## 2022-02-04 MED ORDER — LIDOCAINE 2% (20 MG/ML) 5 ML SYRINGE
INTRAMUSCULAR | Status: AC
  Filled 2022-02-04: qty 5

## 2022-02-04 MED ORDER — ROCURONIUM BROMIDE 10 MG/ML (PF) SYRINGE
PREFILLED_SYRINGE | INTRAVENOUS | Status: AC
  Filled 2022-02-04: qty 10

## 2022-02-04 MED ORDER — PROPOFOL 10 MG/ML IV BOLUS
INTRAVENOUS | Status: DC | PRN
  Administered 2022-02-04: 150 mg via INTRAVENOUS

## 2022-02-04 MED ORDER — 0.9 % SODIUM CHLORIDE (POUR BTL) OPTIME
TOPICAL | Status: DC | PRN
  Administered 2022-02-04: 200 mL

## 2022-02-04 MED ORDER — SUGAMMADEX SODIUM 500 MG/5ML IV SOLN
INTRAVENOUS | Status: AC
  Filled 2022-02-04: qty 5

## 2022-02-04 MED ORDER — OXYCODONE HCL 5 MG PO TABS: 5.0000 mg | ORAL_TABLET | Freq: Once | ORAL | Status: DC | PRN

## 2022-02-04 MED ORDER — ORAL CARE MOUTH RINSE: 15.0000 mL | Freq: Once | OROMUCOSAL | Status: AC

## 2022-02-04 MED ORDER — ACETAMINOPHEN 10 MG/ML IV SOLN: 1000.0000 mg | Freq: Once | INTRAVENOUS | Status: DC | PRN

## 2022-02-04 MED ORDER — CHLORHEXIDINE GLUCONATE 0.12 % MT SOLN: 15.0000 mL | Freq: Once | OROMUCOSAL | Status: AC

## 2022-02-04 MED ORDER — OXYMETAZOLINE HCL 0.05 % NA SOLN
NASAL | Status: AC
  Filled 2022-02-04: qty 30

## 2022-02-04 MED ORDER — MIDAZOLAM HCL 50 MG/10ML IJ SOLN
50.0000 mg | Freq: Once | INTRAMUSCULAR | Status: AC
Start: 2022-02-04 — End: 2022-02-04
  Administered 2022-02-04: 15 mg via INTRAVENOUS
  Filled 2022-02-04: qty 10

## 2022-02-04 MED ORDER — CEFAZOLIN SODIUM-DEXTROSE 2-4 GM/100ML-% IV SOLN
2.0000 g | INTRAVENOUS | Status: AC
  Administered 2022-02-04: 2 g via INTRAVENOUS

## 2022-02-04 MED ORDER — FENTANYL CITRATE (PF) 100 MCG/2ML IJ SOLN: 25.0000 ug | INTRAMUSCULAR | Status: DC | PRN

## 2022-02-04 MED ORDER — ONDANSETRON HCL 4 MG/2ML IJ SOLN
INTRAMUSCULAR | Status: AC
  Filled 2022-02-04: qty 2

## 2022-02-04 MED ORDER — DEXMEDETOMIDINE (PRECEDEX) IN NS 20 MCG/5ML (4 MCG/ML) IV SYRINGE
PREFILLED_SYRINGE | INTRAVENOUS | Status: AC
  Filled 2022-02-04: qty 5

## 2022-02-04 MED ORDER — ROCURONIUM BROMIDE 10 MG/ML (PF) SYRINGE
PREFILLED_SYRINGE | INTRAVENOUS | Status: DC | PRN
  Administered 2022-02-04: 60 mg via INTRAVENOUS

## 2022-02-04 MED ORDER — MIDAZOLAM HCL 2 MG/2ML IJ SOLN
INTRAMUSCULAR | Status: AC
  Filled 2022-02-04: qty 2

## 2022-02-04 MED ORDER — KETAMINE HCL 100 MG/ML IJ SOLN
500.0000 mg | Freq: Once | INTRAMUSCULAR | Status: AC
  Administered 2022-02-04: 300 mg via INTRAVENOUS
  Filled 2022-02-04: qty 5

## 2022-02-04 MED ORDER — CEFAZOLIN SODIUM-DEXTROSE 2-4 GM/100ML-% IV SOLN
INTRAVENOUS | Status: AC
  Filled 2022-02-04: qty 100

## 2022-02-04 MED ORDER — DEXAMETHASONE SODIUM PHOSPHATE 10 MG/ML IJ SOLN
INTRAMUSCULAR | Status: DC | PRN
  Administered 2022-02-04: 10 mg via INTRAVENOUS

## 2022-02-04 MED ORDER — FENTANYL CITRATE (PF) 100 MCG/2ML IJ SOLN
INTRAMUSCULAR | Status: DC | PRN
  Administered 2022-02-04 (×2): 50 ug via INTRAVENOUS

## 2022-02-04 MED ORDER — DEXMEDETOMIDINE (PRECEDEX) IN NS 20 MCG/5ML (4 MCG/ML) IV SYRINGE
PREFILLED_SYRINGE | INTRAVENOUS | Status: DC | PRN
  Administered 2022-02-04: 12 ug via INTRAVENOUS

## 2022-02-04 SURGICAL SUPPLY — 37 items
BAG COUNTER SPONGE SURGICOUNT (BAG) IMPLANT
BAG SPNG CNTER NS LX DISP (BAG)
BLADE SURG 15 STRL LF DISP TIS (BLADE) ×1 IMPLANT
BLADE SURG 15 STRL SS (BLADE)
BUR CROSS CUT FISSURE 1.6 (BURR) ×2 IMPLANT
BUR EGG ELITE 4.0 (BURR) ×2 IMPLANT
CANISTER SUCT 3000ML PPV (MISCELLANEOUS) ×2 IMPLANT
COVER SURGICAL LIGHT HANDLE (MISCELLANEOUS) ×2 IMPLANT
DECANTER SPIKE VIAL GLASS SM (MISCELLANEOUS) ×1 IMPLANT
DRAPE U-SHAPE 76X120 STRL (DRAPES) ×2 IMPLANT
GAUZE PACKING FOLDED 2  STR (GAUZE/BANDAGES/DRESSINGS) ×2
GAUZE PACKING FOLDED 2 STR (GAUZE/BANDAGES/DRESSINGS) ×1 IMPLANT
GLOVE SURG ENC MOIS LTX SZ6.5 (GLOVE) IMPLANT
GLOVE SURG ENC MOIS LTX SZ7 (GLOVE) IMPLANT
GLOVE SURG ENC MOIS LTX SZ8 (GLOVE) ×2 IMPLANT
GLOVE SURG UNDER POLY LF SZ6.5 (GLOVE) IMPLANT
GLOVE SURG UNDER POLY LF SZ7 (GLOVE) IMPLANT
GOWN STRL REUS W/ TWL LRG LVL3 (GOWN DISPOSABLE) ×1 IMPLANT
GOWN STRL REUS W/ TWL XL LVL3 (GOWN DISPOSABLE) ×1 IMPLANT
GOWN STRL REUS W/TWL LRG LVL3 (GOWN DISPOSABLE) ×2
GOWN STRL REUS W/TWL XL LVL3 (GOWN DISPOSABLE) ×2
IV NS 1000ML (IV SOLUTION)
IV NS 1000ML BAXH (IV SOLUTION) ×1 IMPLANT
KIT BASIN OR (CUSTOM PROCEDURE TRAY) ×2 IMPLANT
KIT TURNOVER KIT B (KITS) ×2 IMPLANT
NDL HYPO 25GX1X1/2 BEV (NEEDLE) ×2 IMPLANT
NEEDLE HYPO 25GX1X1/2 BEV (NEEDLE) ×2 IMPLANT
NS IRRIG 1000ML POUR BTL (IV SOLUTION) ×2 IMPLANT
PAD ARMBOARD 7.5X6 YLW CONV (MISCELLANEOUS) ×2 IMPLANT
SLEEVE IRRIGATION ELITE 7 (MISCELLANEOUS) ×2 IMPLANT
SPONGE SURGIFOAM ABS GEL 12-7 (HEMOSTASIS) IMPLANT
SUT CHROMIC 3 0 PS 2 (SUTURE) ×3 IMPLANT
SYR BULB IRRIG 60ML STRL (SYRINGE) ×2 IMPLANT
SYR CONTROL 10ML LL (SYRINGE) ×2 IMPLANT
TRAY ENT MC OR (CUSTOM PROCEDURE TRAY) ×2 IMPLANT
TUBING IRRIGATION (MISCELLANEOUS) ×1 IMPLANT
YANKAUER SUCT BULB TIP NO VENT (SUCTIONS) ×2 IMPLANT

## 2022-02-04 NOTE — Anesthesia Procedure Notes (Addendum)
Procedure Name: Intubation ?Date/Time: 02/04/2022 7:56 AM ?Performed by: Pearson Grippe, CRNA ?Pre-anesthesia Checklist: Patient identified, Emergency Drugs available, Suction available and Patient being monitored ?Patient Re-evaluated:Patient Re-evaluated prior to induction ?Oxygen Delivery Method: Circle system utilized ?Preoxygenation: Pre-oxygenation with 100% oxygen ?Induction Type: IV induction ?Ventilation: Mask ventilation without difficulty ?Laryngoscope Size: Hyacinth Meeker and 2 ?Grade View: Grade I ?Tube type: Oral ?Tube size: 7.5 mm ?Number of attempts: 1 ?Airway Equipment and Method: Stylet and Oral airway ?Placement Confirmation: ETT inserted through vocal cords under direct vision, positive ETCO2 and breath sounds checked- equal and bilateral ?Secured at: 23 cm ?Tube secured with: Tape ?Dental Injury: Teeth and Oropharynx as per pre-operative assessment  ? ? ? ? ?

## 2022-02-04 NOTE — Op Note (Signed)
02/04/2022 ? ?8:09 AM ? ?PATIENT:  [facility]  31 y.o. male ? ?PRE-OPERATIVE DIAGNOSIS:  DENTAL CARIES TOOTH #4 ? ?POST-OPERATIVE DIAGNOSIS:  SAME + PERIODONTAL DISEASE WITH GROSS CALCULUS AND RECESSION # 23, 24, 25 ? ?PROCEDURE:  Procedure(s): ?EXTRACTION TOOTH NUMBER FOUR, EXAM UNDER ANESTHESIA, PERIODONTAL CURRETTAGE AND CALCULUS REMOVAL # 23, 24, 25.  ? ?SURGEON:  Surgeon(s): ?Ocie Doyne, DMD ? ?ANESTHESIA:   local and general ? ?EBL:  minimal ? ?DRAINS: none  ? ?SPECIMEN:  No Specimen ? ?COUNTS:  YES ? ?PLAN OF CARE: Discharge to home after PACU ? ?PATIENT DISPOSITION:  PACU - hemodynamically stable. ?  ?PROCEDURE DETAILS: ?Dictation # [id number] ? ?Georgia Lopes, DMD ?02/04/2022 ?8:09 AM ? ? ? ? ? ? ? ? ? ? ? ? ? ? ? ?  ?

## 2022-02-04 NOTE — Anesthesia Postprocedure Evaluation (Signed)
Anesthesia Post Note ? ?Patient: [facility] ? ?Procedure(s) Performed: EXTRACTION TOOTH NUMBER FOUR, EXAM UNDER ANESTHESIA (Mouth) ? ?  ? ?Patient location during evaluation: PACU ?Anesthesia Type: General ?Level of consciousness: awake and alert ?Pain management: pain level controlled ?Vital Signs Assessment: post-procedure vital signs reviewed and stable ?Respiratory status: spontaneous breathing, nonlabored ventilation, respiratory function stable and patient connected to nasal cannula oxygen ?Cardiovascular status: blood pressure returned to baseline and stable ?Postop Assessment: no apparent nausea or vomiting ?Anesthetic complications: no ? ? ?No notable events documented. ? ?Last Vitals:  ?Vitals:  ? 02/04/22 0830 02/04/22 0845  ?BP: 122/89 118/89  ?Pulse: (!) 115 95  ?Resp: (!) 29 20  ?Temp: 36.4 ?C (!) 36.1 ?C  ?SpO2: 100% 99%  ?  ?Last Pain:  ?Vitals:  ? 02/04/22 0604  ?TempSrc: Oral  ? ? ?  ?  ?  ?  ?  ?  ? ?Trevor Iha ? ? ? ? ?

## 2022-02-04 NOTE — H&P (Signed)
H&P documentation  -History and Physical Reviewed  -Patient has been re-examined  -No change in the plan of care  [facility]  

## 2022-02-04 NOTE — Transfer of Care (Signed)
Immediate Anesthesia Transfer of Care Note ? ?Patient: [facility] ? ?Procedure(s) Performed: EXTRACTION TOOTH NUMBER FOUR, EXAM UNDER ANESTHESIA (Mouth) ? ?Patient Location: PACU ? ?Anesthesia Type:General ? ?Level of Consciousness: drowsy and patient cooperative ? ?Airway & Oxygen Therapy: Patient Spontanous Breathing and Patient connected to face mask oxygen ? ?Post-op Assessment: Report given to RN and Post -op Vital signs reviewed and stable ? ?Post vital signs: Reviewed and stable ? ?Last Vitals:  ?Vitals Value Taken Time  ?BP 122/89 02/04/22 0832  ?Temp    ?Pulse 119 02/04/22 0832  ?Resp 27 02/04/22 0832  ?SpO2 100 % 02/04/22 0832  ?Vitals shown include unvalidated device data. ? ?Last Pain:  ?Vitals:  ? 02/04/22 0604  ?TempSrc: Oral  ?   ? ?  ? ?Complications: No notable events documented. ?

## 2022-02-04 NOTE — Anesthesia Procedure Notes (Deleted)
Procedure Name: Intubation ?Date/Time: 02/04/2022 7:56 AM ?Performed by: Pearson Grippe, CRNA ?Pre-anesthesia Checklist: Patient identified, Emergency Drugs available, Suction available and Patient being monitored ?Patient Re-evaluated:Patient Re-evaluated prior to induction ?Oxygen Delivery Method: Circle system utilized ?Preoxygenation: Pre-oxygenation with 100% oxygen ?Induction Type: IV induction ?Ventilation: Mask ventilation without difficulty ?Laryngoscope Size: Hyacinth Meeker and 2 ?Grade View: Grade I ?Tube type: Oral ?Tube size: 7.5 mm ?Number of attempts: 1 ?Airway Equipment and Method: Stylet and Oral airway ?Placement Confirmation: ETT inserted through vocal cords under direct vision, positive ETCO2 and breath sounds checked- equal and bilateral ?Secured at: 22 cm ?Tube secured with: Tape ?Dental Injury: Teeth and Oropharynx as per pre-operative assessment  ? ? ? ? ?

## 2022-02-04 NOTE — Op Note (Signed)
NAME: [facility], [facility] ?MEDICAL RECORD NO: [id number] ?ACCOUNT NO: [id number] ?DATE OF BIRTH: 22-Sep-1991 ?FACILITY: [facility] ?LOCATION: [facility] ?PHYSICIAN: Georgia Lopes, DDS ? ?Operative Report  ? ?DATE OF PROCEDURE: 02/04/2022 ? ?PREOPERATIVE DIAGNOSIS:  Dental caries tooth number 4. ? ?POSTOPERATIVE DIAGNOSIS:  Dental caries tooth number 4 and periodontal disease with gross calculus and recession at teeth numbers 23, 24, 25. ? ?PROCEDURE:  Extraction tooth number 4.  Exam under anesthesia.  Periodontal curettage and calculus removal of tooth numbers 23, 24, 25. ? ?SURGEON:  Georgia Lopes, DDS ? ?ANESTHESIA:  General oral intubation, Dr. Richardson Landry attending. ? ?INDICATIONS FOR PROCEDURE:  The patient is a 31 year old with autism and is unable to cooperate for dental exam.  He was examined by his general dentist who recommended taking out tooth number 4 upon radiographic review.  Attempt was made in the office  ?to perform the procedure with IV sedation, but the patient was uncooperative, so this surgery was scheduled at [facility] for general anesthesia. ? ?DESCRIPTION OF PROCEDURE:  The patient was taken to the operating room and placed on the table in supine position.  General anesthesia was administered. An oral endotracheal tube was placed and secured on the left side.  Eyes were protected.  The patient ? was draped for surgery.  Timeout was performed.  The posterior pharynx was suctioned and a throat pack was placed.  2% lidocaine 1:100,000 epinephrine was infiltrated around tooth number 4 buccally and palatally and then the tooth was elevated and  ?removed with dental forceps.  The socket was curetted.  The oral tissues were examined.  There were no obvious caries.  There was some calculus buildup at the distal of tooth number 15.  This was removed with a curette.  The lower teeth exhibited no  ?carious lesions.  There was, however, severe gingival recession buccally on teeth numbers 23, 24, 25 with calculus  buildup.  Local anesthesia was administered here and then using a dental curette, the calculus was removed and the root surfaces were  ?scraped and then lingual debridement was done around these 3 teeth as well.  Then, the oral cavity was irrigated.  Throat pack was removed.  The patient was left under care of anesthesia for extubation and transferred to recovery room with plans for  ?discharge home through day surgery. ? ?ESTIMATED BLOOD LOSS:  Minimum. ? ?COMPLICATIONS:  None. ? ?SPECIMENS:  None. ? ? ?PAA ?D: 02/04/2022 8:13:25 am T: 02/04/2022 10:17:00 am  ?JOB: [id number]/ [id number]  ?

## 2022-02-05 ENCOUNTER — Encounter (HOSPITAL_COMMUNITY): Payer: Self-pay | Admitting: Oral Surgery
